# Patient Record
Sex: Female | Born: 2000 | Race: White | Hispanic: No | Marital: Single | Smoking: Current every day smoker
Health system: Southern US, Community
[De-identification: ages and names within clinical notes are randomized; demographics above are authoritative.]

## PROBLEM LIST (undated history)

## (undated) ENCOUNTER — Emergency Department (HOSPITAL_BASED_OUTPATIENT_CLINIC_OR_DEPARTMENT_OTHER): Payer: Medicaid Other

## (undated) DIAGNOSIS — E282 Polycystic ovarian syndrome: Secondary | ICD-10-CM

## (undated) DIAGNOSIS — R011 Cardiac murmur, unspecified: Secondary | ICD-10-CM

## (undated) DIAGNOSIS — R748 Abnormal levels of other serum enzymes: Secondary | ICD-10-CM

## (undated) HISTORY — PX: OTHER SURGICAL HISTORY: SHX169

## (undated) HISTORY — DX: Cardiac murmur, unspecified: R01.1

## (undated) HISTORY — DX: Polycystic ovarian syndrome: E28.2

---
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
## 2020-06-08 ENCOUNTER — Encounter: Payer: Self-pay | Admitting: Family Medicine

## 2020-06-08 ENCOUNTER — Ambulatory Visit (INDEPENDENT_AMBULATORY_CARE_PROVIDER_SITE_OTHER): Payer: Medicaid Other | Admitting: Family Medicine

## 2020-06-08 ENCOUNTER — Other Ambulatory Visit: Payer: Self-pay

## 2020-06-08 DIAGNOSIS — G8929 Other chronic pain: Secondary | ICD-10-CM | POA: Diagnosis not present

## 2020-06-08 DIAGNOSIS — M5441 Lumbago with sciatica, right side: Secondary | ICD-10-CM

## 2020-06-08 NOTE — Progress Notes (Signed)
Office Visit Note   Patient: Lindsay [name]           Date of Birth: August 08, 2000           MRN: [id number] Visit Date: 06/08/2020 Requested by: Berline Lopes, MD [address] [place],  [place] [address] PCP: Berline Lopes, MD  Subjective: Chief Complaint  Patient presents with  . Lower Back - Pain    Pain in the right side of the lower back after changing the cat litter 1&1/2 months ago. She thinks she moved in an awkward way with this. Could barely walk and the pain was going down the leg. The pain goes across the lower back, but is still mainly on the right side. Has finished course of prednisone.  H/o 2 herniated discs in the lsp in 2019 - got better with PT and rest.     HPI: 20yo F presenting to clinic with acute on chronic right sided lower back, radiating into her right posterior thigh. Patient states that she first experienced back pain in 2019, during which an MRI revealed two herniated discs and she did a course of physical therapy. Following PT, she felt much improved, though not 100% pain free. Pain had been management for the past two years, until she was cleaning out a cat box about 6 weeks ago. She went to stand up from cleaning the litter, and says she felt immediate pain in the right side of her lower back.  Her mother took her to a physical therapy for dry needling, but her pain was too significant at the time, and the PT asked her to see her PCM first. PCM gave her a prednisone pack, as well as a toradol injection. This improved her symptoms significantly, and she is feeling much better today, though not yet back to her baseline. She has also done one course of dry needling, but states this was very painful, and she isn't sure she can tolerated continued treatments.  Radiating pain stops at the back of her right knee, and does not travel into her foot. No weakness, no bowel/bladder dysfunction. Pain is worse with going up and down stairs.               ROS:   All  other systems were reviewed and are negative.  Objective: Vital Signs: There were no vitals taken for this visit.  Physical Exam:  General:  Alert and oriented, in no acute distress. Pulm:  Breathing unlabored. Psy:  Normal mood, congruent affect. Skin:  Lower back with no bruising, rashes or erythema.   BACK EXAMINATION: Normal Gait.  Stands with excessive lumbar lordosis, with anterior tilt to pelvis.  ROM: Endorses pain with forward flexion, and is just shy of toe-touch. No significant pain with extension. No pain with rotation. Endorses pain with side-bending to the left.  Palpation: Tenderness to lower lumbar paraspinal muscles on the right, at the area of L4-L5, L5-S1. Tenderness within the right SI Joint. No midline tenderness, no deformity or step-offs. Gluteus musculature and piriformis without tender points.  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  Strength: Hip flexion (L1), Hip Aduction (L2), Knee Extension (L3) are 5/5 Bilaterally Foot Inversion (L4), Dorsiflexion (L5), and Eversion (S1) 5/5 Bilaterally Sensation: Intact to light touch medial and lateral aspects of lower extremities, and lateral, dorsal, and medial aspects of foot.  Reflexes: Patellar (L4) 2+ Bilaterally Special Tests:  FABER causes some SI Joint pain on right Stork test negative bilaterally Trendelenberg  very slightly positive bilaterally SLR: No radiation down Ipilateral or contralateral leg bilaterally Limb Length: Hips Aligned, No obvious discrepancy at medial malleolus      Imaging: No results found.  Assessment & Plan: 20yo F presenting to clinic with concerns of acute on chronic lower back pain, in the setting of historical disc herniations in 2019. Symptoms do sound concerning for radiculopathy, though no red flags on examination today.  - Given improvement with PT in the past, suspect she would benefit from this again. Will place ambulatory referral - Strong family  history of low back pathology. Recommend she find a healthy back routine once she graduates from PT to maintain her core/back strength. Consider Pilates or similar routines.  - No need for repeated imaging at this time. Should her symptoms worsen or fail to improve, she is to return to clinic for reconsideration. - Sacral torsion addressed with HVLA Sacral-C technique, which patient tolerated very well. - Patient and her mother had no further questions or concerns today. They express understanding with plan.      Procedures: No procedures performed        PMFS History: Patient Active Problem List   Diagnosis Date Noted  . Adjustment disorder with mixed anxiety and depressed mood 07/04/2016  . Vitamin D deficiency 10/26/2014  . Hyperlipidemia 10/26/2014  . Acne vulgaris 10/26/2014  . PCOS (polycystic ovarian syndrome) 08/09/2014  . Irregular menses 06/06/2014  . BMI (body mass index), pediatric, > 99% for age 47/22/2016  . Acanthosis nigricans 06/06/2014   Past Medical History:  Diagnosis Date  . Abnormal liver enzymes   . Heart murmur   . PCOS (polycystic ovarian syndrome)     Family History  Problem Relation Age of Onset  . Diabetes Other   . Hypertension Other   . Hyperlipidemia Other   . Heart disease Other     Past Surgical History:  Procedure Laterality Date  . None     Social History   Occupational History  . Not on file  Tobacco Use  . Smoking status: Passive Smoke Exposure - Never Smoker  . Smokeless tobacco: Never Used  . Tobacco comment: dads smokes inside mom smokes outside   Substance and Sexual Activity  . Alcohol use: Not on file  . Drug use: Not on file  . Sexual activity: Not on file

## 2020-06-08 NOTE — Progress Notes (Signed)
I saw and examined the patient with Dr. Marga Hoots and agree with assessment and plan as outlined.    Right LBP since 2019 intermittently.  Flared up recently while lifting cat litter.  Saw Karin Golden but didn't tolerate dry needling.  Neuro exam nonfocal.  Forward tilt of pelvis; tender near right SI and gluteus medius.    Will try PT at [facility] in [place].  Repeat MRI if fails to improve.  Corset brace for comfort.

## 2020-06-28 ENCOUNTER — Encounter: Payer: Self-pay | Admitting: Family Medicine

## 2020-06-28 DIAGNOSIS — G8929 Other chronic pain: Secondary | ICD-10-CM

## 2020-07-13 ENCOUNTER — Other Ambulatory Visit: Payer: Self-pay

## 2020-07-13 ENCOUNTER — Ambulatory Visit: Payer: Medicaid Other | Attending: Family Medicine | Admitting: Physical Therapy

## 2020-07-13 DIAGNOSIS — M6281 Muscle weakness (generalized): Secondary | ICD-10-CM | POA: Insufficient documentation

## 2020-07-13 DIAGNOSIS — M545 Low back pain, unspecified: Secondary | ICD-10-CM | POA: Diagnosis not present

## 2020-07-13 DIAGNOSIS — G8929 Other chronic pain: Secondary | ICD-10-CM

## 2020-07-13 NOTE — Patient Instructions (Addendum)
Access Code: [email or web address] URL: [email or web address] Date: 07/13/2020 Prepared by: Alphonzo Severance  Exercises Marching Bridge - 1 x daily - 7 x weekly - 3 sets - 10 reps Plank on Knees - 1 x daily - 7 x weekly - 1 sets - 3 reps - 30 hold Supine Posterior Pelvic Tilt - 2 x daily - 7 x weekly - 2 sets - 10 reps - 5'' hold

## 2020-07-13 NOTE — Therapy (Signed)
[facility] [address] [place], [place], [address] Phone: [phone]   Fax:  [phone]  Physical Therapy Evaluation  Patient Details  Name: Lindsay [name] MRN: [id number] Date of Birth: March 12, 2000 Referring Provider (PT): Lavada Mesi, MD  Encounter Date: 07/13/2020   PT End of Session - 07/13/20 1435     Visit Number 1    Number of Visits 16    Date for PT Re-Evaluation 09/30/20    Authorization Type [employer] [facility] -    Authorization - Number of Visits 27    PT Start Time 1400    PT Stop Time 1440    PT Time Calculation (min) 40 min    Activity Tolerance Patient tolerated treatment well    Behavior During Therapy [facility] for tasks assessed/performed                    Past Medical History:  Diagnosis Date   Abnormal liver enzymes    Heart murmur    PCOS (polycystic ovarian syndrome)     Past Surgical History:  Procedure Laterality Date   None      There were no vitals filed for this visit.    Subjective Assessment - 07/13/20 1412     Subjective Lindsay [name] is a 20 y.o. female who presents to clinic with chief complaint of low back pain.  MOI/History of condition: history of acute on chronic low back pain after lifting cat litter around May of 2022; some radiating pain into R leg originally but this has now subsided.  Pain location: R sided LBP, near PSIS.  Red flags: denies n/t, denies bb changes, denies saddle anesthesia.  48 hour pain intensity:  highest 2/10, current 1/10, best 1/10.  Aggs: physical activity (no specific movements), lifting.  Eases: rest, lidocaine patches.  Nature: achy with occasional sharp.  Severity: low  Irritability: low.    Stage: chronic.  Stability: getting better.  24 hour pattern: no pattern.  Vocation/requirements: none.  Hobbies: singing and video games.  Functional limitations/goals: be able to self manage back pain.  Home environment: live with family, 7 STE, single story  house.  Assistive device: none.    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, G89.29)    Referring Provider (PT) Lavada Mesi, MD    Onset Date/Surgical Date 07/13/17    Hand Dominance Right    Next MD Visit Hilts, Michael, MD    Prior Therapy multiple years ago for low back      Precautions   Precaution Comments none      Restrictions   Other Position/Activity Restrictions none      Balance Screen   Has the patient fallen in the past 6 months No      Functional Tests   Functional tests Single leg stance;Other;Other2      Single Leg Stance   Comments 30'' able with mild hip drop bil      Other:   Other/ Comments Plank: 30''      Other:   Other/Comments toe and heel walk WNL      Posture/Postural Control   Posture Comments slight anterior pelvic tilt      ROM / Strength   AROM / PROM /  Strength AROM;Strength      AROM   Overall AROM Comments all WNL    AROM Assessment Site Lumbar    Lumbar Flexion P! at end range    Lumbar Extension P! at end range      Strength   Strength Assessment Site Hip;Knee    Right Hip Flexion 5/5    Right Hip Extension 5/5    Right Hip ABduction 5/5    Left Hip Flexion 5/5    Left Hip Extension 4/5    Left Hip ABduction 4/5      Flexibility   Soft Tissue Assessment /Muscle Length yes   L hip flexor tightness> R   Hamstrings WNL    Quadriceps WNL      Palpation   Spinal mobility WNL    Palpation comment TTP lower lumbar spine      Special Tests   Other special tests SLR (-)                        Objective measurements completed on examination: See above findings.               PT Education - 07/13/20 1433     Education Details POC, diagnosis, prognosis, HEP.  Pt educated via explanation, demonstration, and handout (HEP).   Pt confirms understanding verbally.              PT Short Term Goals - 07/13/20 1507       PT SHORT TERM GOAL #1   Title Lindsay [name] will be >75% HEP compliant within 3 weeks to improve carryover between sessions and facilitate independent management of condition.    Target Date 08/03/20               PT Long Term Goals - 07/13/20 1507       PT LONG TERM GOAL #1   Title Lindsay [name] will be able to maintain plank for 40'' (norm for healthy adult is 70'')  EVAL: 20''    Target Date 09/30/20      PT LONG TERM GOAL #2   Title Lindsay [name] will report confidence in self management of condition at time of discharge    Target Date 09/30/20      PT LONG TERM GOAL #3   Title Lindsay [name] will be able to lift 45 lbs from the floor and place on a 3 foot counter, not limited by pain  EVAL: unable    Target Date 09/30/20                    Plan - 07/13/20 1459     Clinical Impression Statement Lindsay [name] is a 20 y.o. female who presents to clinic with signs and sxs consistent with mechanical low back pain.  Pt presents with pain and impairments/deficits in: L hip strength, core strength.  Activity limitations include: lifting, bending.  Participation limitations include: lifting nephew, bending for housework, sitting for videogames.  Pt will benefit from skilled therapy to address pain and the listed deficits in order to achieve functional goals, enable safety and independence in completion of daily tasks, and return to PLOF.    Stability/Clinical Decision Making Stable/Uncomplicated    Clinical Decision Making Low    Rehab Potential Excellent    PT Frequency 2x / week    PT Duration Other (comment)   09/30/2020   PT Treatment/Interventions --   ADLs/Self Care Home Management; Aquatic Therapy; Therapeutic activities;  Therapeutic exercise; Neuromuscular re-education; Manual techniques; Iontophoresis 4mg /ml Dexamethasone; Dry needling  Traction; Spinal  Manipulations; Joint Manipulations;   PT Next Visit Plan progressive core strengthening and D/L progression    PT Home Exercise Plan [email or web address]    Consulted and Agree with Plan of Care Patient             Patient will benefit from skilled therapeutic intervention in order to improve the following deficits and impairments:     Visit Diagnosis: Chronic right-sided low back pain without sciatica  Muscle weakness     Problem List Patient Active Problem List   Diagnosis Date Noted   Adjustment disorder with mixed anxiety and depressed mood 07/04/2016   Vitamin D deficiency 10/26/2014   Hyperlipidemia 10/26/2014   Acne vulgaris 10/26/2014   PCOS (polycystic ovarian syndrome) 08/09/2014   Irregular menses 06/06/2014   BMI (body mass index), pediatric, > 99% for age 27/22/2016   Acanthosis nigricans 06/06/2014    06/08/2014 PT, DPT 07/13/20 3:13 PM  [facility] [address] [place], [place], [place] Phone: [phone]   Fax:  [phone]  Name: Lindsay [name] MRN: Lindsay [name] Date of Birth: 16-Dec-2000

## 2020-08-10 ENCOUNTER — Other Ambulatory Visit: Payer: Self-pay

## 2020-08-10 ENCOUNTER — Ambulatory Visit: Payer: Medicaid Other | Attending: Family Medicine | Admitting: Physical Therapy

## 2020-08-10 ENCOUNTER — Encounter: Payer: Self-pay | Admitting: Physical Therapy

## 2020-08-10 DIAGNOSIS — G8929 Other chronic pain: Secondary | ICD-10-CM | POA: Insufficient documentation

## 2020-08-10 DIAGNOSIS — M6281 Muscle weakness (generalized): Secondary | ICD-10-CM | POA: Diagnosis present

## 2020-08-10 DIAGNOSIS — M545 Low back pain, unspecified: Secondary | ICD-10-CM | POA: Insufficient documentation

## 2020-08-10 NOTE — Therapy (Signed)
[facility] [address] [place], [place], [address] Phone: [phone]   Fax:  [phone]  Physical Therapy Treatment  Patient Details  Name: Lindsay [name] MRN: [id number] Date of Birth: 10/21/2000 Referring Provider (PT): Lavada Mesi, MD   Encounter Date: 08/10/2020   PT End of Session - 08/10/20 1402     Visit Number 2    Number of Visits 16    Date for PT Re-Evaluation 09/30/20    Authorization Type [employer] -    Authorization - Number of Visits 27    PT Start Time 1400    PT Stop Time 1443    PT Time Calculation (min) 43 min    Activity Tolerance Patient tolerated treatment well    Behavior During Therapy WFL for tasks assessed/performed             Past Medical History:  Diagnosis Date   Abnormal liver enzymes    Heart murmur    PCOS (polycystic ovarian syndrome)     Past Surgical History:  Procedure Laterality Date   None      There were no vitals filed for this visit.   Subjective Assessment - 08/10/20 1406     Subjective Pt reports that her back has been doing well overall.  [place] the [place] and had some back pain following standing and walking during the day.  Currently 0/10 pain.  She reports that she has been HEP compliant.    Pertinent History History of 2 bulging disks 2019            [facility] Adult PT Treatment/Exercise:  Therapeutic Exercise: - Recumbent bike - 5' L4 - Side plank - 15'' x4 ea - Side plank with clamshell - 3x5 - bird dog - 3x6 with 5'' hold - dead bug with ball - 3x10 - dead lift - 8'' step - 3x6 @25 # - S/L bridge - 2x10 ea    PT Short Term Goals - 07/13/20 1507       PT SHORT TERM GOAL #1   Title 07/15/20 will be >75% HEP compliant within 3 weeks to improve carryover between sessions and facilitate independent management of condition.    Target Date 08/03/20               PT Long Term Goals - 07/13/20 1507       PT LONG TERM GOAL #1    Title 07/15/20 will be able to maintain plank for 40'' (norm for healthy adult is 70'')  EVAL: 20''    Target Date 09/30/20      PT LONG TERM GOAL #2   Title Lindsay [name] will report confidence in self management of condition at time of discharge    Target Date 09/30/20      PT LONG TERM GOAL #3   Title Lindsay [name] will be able to lift 45 lbs from the floor and place on a 3 foot counter, not limited by pain  EVAL: unable    Target Date 09/30/20                   Plan - 08/10/20 1428     Clinical Impression Statement Pt reports no increase in baseline pain following therapy  HEP was updated and reissued to patient    Overall, Lindsay [name] is progressing well with therapy.  Today we concentrated on core strengthening and hip strengthening.  Pt shows good progress with exercise intensity.  We  will continue lumbar strengthening and hip strengthening as tolerated.  Pt will continue to benefit from skilled physical therapy to address remaining deficits and achieve listed goals.  Continue per POC.    Stability/Clinical Decision Making Stable/Uncomplicated    Rehab Potential Excellent    PT Frequency 2x / week    PT Duration Other (comment)   09/30/2020   PT Treatment/Interventions --   ADLs/Self Care Home Management; Aquatic Therapy; Therapeutic activities; Therapeutic exercise; Neuromuscular re-education; Manual techniques; Iontophoresis 4mg /ml Dexamethasone; Dry needling  Traction; Spinal Manipulations; Joint Manipulations;   PT Next Visit Plan progressive core strengthening and D/L progression    PT Home Exercise Plan [id number]    Consulted and Agree with Plan of Care Patient             Patient will benefit from skilled therapeutic intervention in order to improve the following deficits and impairments:     Visit Diagnosis: Chronic right-sided low back pain without sciatica  Muscle weakness     Problem List Patient Active Problem List   Diagnosis Date  Noted   Adjustment disorder with mixed anxiety and depressed mood 07/04/2016   Vitamin D deficiency 10/26/2014   Hyperlipidemia 10/26/2014   Acne vulgaris 10/26/2014   PCOS (polycystic ovarian syndrome) 08/09/2014   Irregular menses 06/06/2014   BMI (body mass index), pediatric, > 99% for age 68/22/2016   Acanthosis nigricans 06/06/2014    06/08/2014 PT, DPT 08/10/20 2:46 PM   [facility] [address] [place], [place], [place] Phone: [phone]   Fax:  [phone]  Name: Lindsay [name] MRN: [name] Date of Birth: 2000-05-25

## 2020-08-12 ENCOUNTER — Ambulatory Visit: Payer: Medicaid Other | Admitting: Physical Therapy

## 2020-08-12 ENCOUNTER — Encounter: Payer: Self-pay | Admitting: Physical Therapy

## 2020-08-12 ENCOUNTER — Other Ambulatory Visit: Payer: Self-pay

## 2020-08-12 DIAGNOSIS — M545 Low back pain, unspecified: Secondary | ICD-10-CM

## 2020-08-12 DIAGNOSIS — G8929 Other chronic pain: Secondary | ICD-10-CM

## 2020-08-12 DIAGNOSIS — M6281 Muscle weakness (generalized): Secondary | ICD-10-CM

## 2020-08-12 NOTE — Therapy (Signed)
Lindsay [name] [address] [place], [place], [address] Phone: [phone]   Fax:  [phone]  Physical Therapy Treatment  Patient Details  Name: Lindsay [name] MRN: [id number] Date of Birth: 2000-10-15 Referring Provider (PT): Lindsay Mesi, MD   Encounter Date: 08/12/2020   PT End of Session - 08/12/20 1347     Visit Number 3    Number of Visits 16    Date for PT Re-Evaluation 09/30/20    Authorization Type [employer] -    Authorization - Number of Visits 27    PT Start Time 0145    PT Stop Time 0227    PT Time Calculation (min) 42 min    Activity Tolerance Patient tolerated treatment well    Behavior During Therapy [facility] for tasks assessed/performed             Past Medical History:  Diagnosis Date   Abnormal liver enzymes    Heart murmur    PCOS (polycystic ovarian syndrome)     Past Surgical History:  Procedure Laterality Date   None      There were no vitals filed for this visit.   Subjective Assessment - 08/12/20 1347     Subjective Pt reports that she has some muscular soreness after last session.  She feels last session was challenging.  She currenlty has 0/10 pain.    Pertinent History History of 2 bulging disks 2019             [facility] Adult PT Treatment/Exercise:   Therapeutic Exercise: - Recumbent bike - 5' L4 - Side plank with clamshell - 3x7 ea - RTB  - bird dog - 3x8 with 5'' hold - dead bug with ball - 3x10 - dead lift - 8'' step - 1x5 @25 #, 10 individual lifts @45  lbs - S/L bridge - 2x10 ea - 2'' hold - Hip abd machine @25  3x8 ea    PT Short Term Goals - 07/13/20 1507       PT SHORT TERM GOAL #1   Title will be >75% HEP compliant within 3 weeks to improve carryover between sessions and facilitate independent management of condition.    Target Date 08/03/20               PT Long Term Goals - 07/13/20 1507       PT LONG TERM GOAL #1   Title Lindsay [name]  will be able to maintain plank for 40'' (norm for healthy adult is 70'')  EVAL: 20''    Target Date 09/30/20      PT LONG TERM GOAL #2   Title Lindsay [name] will report confidence in self management of condition at time of discharge    Target Date 09/30/20      PT LONG TERM GOAL #3   Title Lindsay [name] will be able to lift 45 lbs from the floor and place on a 3 foot counter, not limited by pain  EVAL: unable    Target Date 09/30/20                   Plan - 08/12/20 1421     Clinical Impression Statement Pt reports no increase in baseline pain following therapy  HEP was reviewed, but left unchanged    Overall, Lindsay [name] is progressing very well with therapy.  Today we concentrated on core strengthening and hip strengthening.  Intensity left roughly the same as pt reported DOMS and significant fatigue after  last session; this session tolerated well.  Pt will continue to benefit from skilled physical therapy to address remaining deficits and achieve listed goals.  Continue per POC.    Stability/Clinical Decision Making Stable/Uncomplicated    Rehab Potential Excellent    PT Frequency 2x / week    PT Duration Other (comment)   09/30/2020   PT Treatment/Interventions --   ADLs/Self Care Home Management; Aquatic Therapy; Therapeutic activities; Therapeutic exercise; Neuromuscular re-education; Manual techniques; Iontophoresis 4mg /ml Dexamethasone; Dry needling  Traction; Spinal Manipulations; Joint Manipulations;   PT Next Visit Plan progressive core strengthening and D/L progression    PT Home Exercise Plan [id number]    Consulted and Agree with Plan of Care Patient             Patient will benefit from skilled therapeutic intervention in order to improve the following deficits and impairments:     Visit Diagnosis: Chronic right-sided low back pain without sciatica  Muscle weakness     Problem List Patient Active Problem List   Diagnosis Date Noted    Adjustment disorder with mixed anxiety and depressed mood 07/04/2016   Vitamin D deficiency 10/26/2014   Hyperlipidemia 10/26/2014   Acne vulgaris 10/26/2014   PCOS (polycystic ovarian syndrome) 08/09/2014   Irregular menses 06/06/2014   BMI (body mass index), pediatric, > 99% for age 17/22/2016   Acanthosis nigricans 06/06/2014   06/08/2014 PT, DPT 08/12/20 2:31 PM  [name] [address] [place], [place], [place] Phone: [phone]   Fax:  [phone]  Name: Lindsay [name] MRN: Lindsay [name] Date of Birth: [birth year]

## 2020-08-16 ENCOUNTER — Encounter: Payer: Self-pay | Admitting: Physical Therapy

## 2020-08-16 ENCOUNTER — Other Ambulatory Visit: Payer: Self-pay

## 2020-08-16 ENCOUNTER — Ambulatory Visit: Payer: Medicaid Other | Attending: Family Medicine | Admitting: Physical Therapy

## 2020-08-16 DIAGNOSIS — M545 Low back pain, unspecified: Secondary | ICD-10-CM | POA: Insufficient documentation

## 2020-08-16 DIAGNOSIS — G8929 Other chronic pain: Secondary | ICD-10-CM

## 2020-08-16 DIAGNOSIS — M6281 Muscle weakness (generalized): Secondary | ICD-10-CM | POA: Insufficient documentation

## 2020-08-16 NOTE — Therapy (Signed)
[facility] [address] [place], [place], [address] Phone: [phone]   Fax:  [phone]  Physical Therapy Treatment  Patient Details  Name: Lindsay [name] MRN: [id number] Date of Birth: 05/16/2000 Referring Provider (PT): Lindsay Mesi, MD   Encounter Date: 08/16/2020   PT End of Session - 08/16/20 1401     Visit Number 4    Number of Visits 16    Date for PT Re-Evaluation 09/30/20    Authorization Type [employer] -    Authorization - Number of Visits 27    PT Start Time 0200    PT Stop Time 0243    PT Time Calculation (min) 43 min    Activity Tolerance Patient tolerated treatment well    Behavior During Therapy [facility] for tasks assessed/performed             Past Medical History:  Diagnosis Date   Abnormal liver enzymes    Heart murmur    PCOS (polycystic ovarian syndrome)     Past Surgical History:  Procedure Laterality Date   None      There were no vitals filed for this visit.   Subjective Assessment - 08/16/20 1404     Subjective Pt reports that she is doing well well and improving overall.  She still gets some back pain when standing or doing extended exercise, but this is improivng.  3/10 back pain over the weekend while completing a concealed carry permit course.  0/10 back pain currently.    Pertinent History History of 2 bulging disks 2019             [facility] Adult PT Treatment/Exercise:   Therapeutic Exercise: - Recumbent bike - 5' L4 - LE X-over - 2x10 ea - Side plank with clamshell - 2x10 ea - GTB  - bird dog - 10x with 10'' hold - 1/2 kneeling chop - 10x ea - 10 lbs - dead bug with ball - 2x14 - dead lift - 2x5 @25 #, 10 individual lifts @45  lbs - S/L bridge - 3x5 ea with 5# KB hold - Hip abd machine @25  3x8 ea     PT Short Term Goals - 07/13/20 1507       PT SHORT TERM GOAL #1   Title will be >75% HEP compliant within 3 weeks to improve carryover between sessions  and facilitate independent management of condition.    Target Date 08/03/20               PT Long Term Goals - 07/13/20 1507       PT LONG TERM GOAL #1   Title Lindsay [name] will be able to maintain plank for 40'' (norm for healthy adult is 70'')  EVAL: 20''    Target Date 09/30/20      PT LONG TERM GOAL #2   Title Lindsay [name] will report confidence in self management of condition at time of discharge    Target Date 09/30/20      PT LONG TERM GOAL #3   Title Lindsay [name] will be able to lift 45 lbs from the floor and place on a 3 foot counter, not limited by pain  EVAL: unable    Target Date 09/30/20                   Plan - 08/16/20 1439     Clinical Impression Statement Pt reports no increase in baseline pain following therapy  HEP was not updated  as current program meets patient's needs    Overall, Lindsay [name] is progressing well with therapy.  Today we concentrated on core strengthening and lower extremity strengthening.  Pt is consistently tolerating higher load with core strengthening exercises and exhibits improved control with S/L bridge with minimal pelvic rotation.  Able to D/L KB from ground today with min cuing and good form.  Pt will continue to benefit from skilled physical therapy to address remaining deficits and achieve listed goals.  Continue per POC.    Stability/Clinical Decision Making Stable/Uncomplicated    Rehab Potential Excellent    PT Frequency 2x / week    PT Duration Other (comment)   09/30/2020   PT Treatment/Interventions --   ADLs/Self Care Home Management; Aquatic Therapy; Therapeutic activities; Therapeutic exercise; Neuromuscular re-education; Manual techniques; Iontophoresis 4mg /ml Dexamethasone; Dry needling  Traction; Spinal Manipulations; Joint Manipulations;   PT Next Visit Plan progressive core strengthening and D/L progression    PT Home Exercise Plan [id number]    Consulted and Agree with Plan of Care Patient              Patient will benefit from skilled therapeutic intervention in order to improve the following deficits and impairments:     Visit Diagnosis: Chronic right-sided low back pain without sciatica  Muscle weakness     Problem List Patient Active Problem List   Diagnosis Date Noted   Adjustment disorder with mixed anxiety and depressed mood 07/04/2016   Vitamin D deficiency 10/26/2014   Hyperlipidemia 10/26/2014   Acne vulgaris 10/26/2014   PCOS (polycystic ovarian syndrome) 08/09/2014   Irregular menses 06/06/2014   BMI (body mass index), pediatric, > 99% for age 04/06/2014   Acanthosis nigricans 06/06/2014    06/08/2014 PT, DPT 08/16/20 2:39 PM   [facility] [address] [place], [place], [place] Phone: [phone]   Fax:  [phone]  Name: Lindsay [name] MRN: Lindsay [name] Date of Birth: [birth year]

## 2020-08-26 ENCOUNTER — Ambulatory Visit: Payer: Medicaid Other | Admitting: Physical Therapy

## 2020-08-26 ENCOUNTER — Other Ambulatory Visit: Payer: Self-pay

## 2020-08-26 ENCOUNTER — Encounter: Payer: Self-pay | Admitting: Physical Therapy

## 2020-08-26 DIAGNOSIS — G8929 Other chronic pain: Secondary | ICD-10-CM

## 2020-08-26 DIAGNOSIS — M6281 Muscle weakness (generalized): Secondary | ICD-10-CM

## 2020-08-26 DIAGNOSIS — M545 Low back pain, unspecified: Secondary | ICD-10-CM | POA: Diagnosis not present

## 2020-08-26 NOTE — Therapy (Addendum)
[facility] [address] [place], [place], [address] Phone: [phone]   Fax:  [phone]  Physical Therapy Treatment  Patient Details  Name: Lindsay [name] MRN: [id number] Date of Birth: 02/10/2000 Referring Provider (PT): Lavada Mesi, MD   Encounter Date: 08/26/2020   PT End of Session - 08/26/20 1340     Visit Number 5    Number of Visits 16    Date for PT Re-Evaluation 09/30/20    Authorization Type [employer] Approved 12 Pt visits from 08/10/2020-10/09/2020    Authorization - Number of Visits 12    PT Start Time 1341    PT Stop Time 1425    PT Time Calculation (min) 44 min    Activity Tolerance Patient tolerated treatment well    Behavior During Therapy [facility] for tasks assessed/performed             Past Medical History:  Diagnosis Date   Abnormal liver enzymes    Heart murmur    PCOS (polycystic ovarian syndrome)     Past Surgical History:  Procedure Laterality Date   None      There were no vitals filed for this visit.    [facility] Adult PT Treatment/Exercise:   Therapeutic Exercise, to improve core strength to reduce low back pain.  Pt cued for sequencing and form throughout: - Recumbent bike - 5' L4 - LE X-over - 2x10 ea - bird dog - 2x6 with 10'' hold with hand/knee tap - dead lift - 2x @45 #  2x5 @ 55# (raised bar in squat rack) - S/L bridge - 3x7 ea with 5# KB hold - RDL with reach to chair with furniture slider - 2x10 ea - RDL reaching to chair - 2x10 ea  NOT PERFORMED TODAY  - 1/2 kneeling chop - 10x ea - 10 lbs - dead bug with ball - 2x14  - Side plank with clamshell - 2x10 ea - GTB (not today) - Hip abd machine @25  3x8 ea   PT Short Term Goals - 08/27/20 0837       PT SHORT TERM GOAL #1   Title will be >75% HEP compliant within 3 weeks to improve carryover between sessions and facilitate independent management of condition.    Status Achieved    Target Date 08/03/20                PT Long Term Goals - 07/13/20 1507       PT LONG TERM GOAL #1   Title 08/05/20 will be able to maintain plank for 40'' (norm for healthy adult is 70'')  EVAL: 20''    Target Date 09/30/20      PT LONG TERM GOAL #2   Title Lindsay [name] will report confidence in self management of condition at time of discharge    Target Date 09/30/20      PT LONG TERM GOAL #3   Title Lindsay [name] will be able to lift 45 lbs from the floor and place on a 3 foot counter, not limited by pain  EVAL: unable    Target Date 09/30/20                   Plan - 08/26/20 1356     Clinical Impression Statement Pt reports mild pain reduction following therapy  HEP was reviewed, but left unchanged    Overall, Lindsay [name] is progressing well with therapy.  Today we concentrated on core strengthening.  Intensity left  roughly the same d/t time since last visit and increased baseline pain today.  Will continue to progress as able.  Pt does well with min cuing for D/L form today with good carry-over from pervious sessions.  Pt will continue to benefit from skilled physical therapy to address remaining deficits and achieve listed goals.  Continue per POC.    Stability/Clinical Decision Making Stable/Uncomplicated    Rehab Potential Excellent    PT Frequency 2x / week    PT Duration Other (comment)   09/30/2020   PT Treatment/Interventions ADLs/Self Care Home Management;Aquatic Therapy;Iontophoresis 4mg /ml Dexamethasone;Gait training;Therapeutic activities;Therapeutic exercise;Neuromuscular re-education;Manual techniques;Dry needling;Joint Manipulations;Spinal Manipulations    PT Next Visit Plan progressive core strengthening and D/L progression    PT Home Exercise Plan [id number]    Consulted and Agree with Plan of Care Patient             Patient will benefit from skilled therapeutic intervention in order to improve the following deficits and impairments:  Pain, Decreased  strength  Visit Diagnosis: Chronic right-sided low back pain without sciatica  Muscle weakness     Problem List Patient Active Problem List   Diagnosis Date Noted   Adjustment disorder with mixed anxiety and depressed mood 07/04/2016   Vitamin D deficiency 10/26/2014   Hyperlipidemia 10/26/2014   Acne vulgaris 10/26/2014   PCOS (polycystic ovarian syndrome) 08/09/2014   Irregular menses 06/06/2014   BMI (body mass index), pediatric, > 99% for age 20/22/2016   Acanthosis nigricans 06/06/2014    06/08/2014 PT, DPT 08/27/20 8:41 AM   [facility] [address] [place], [place], [place] Phone: [phone]   Fax:  [phone]  Name: Lindsay [name] MRN: [name] Date of Birth: 06/25/2000

## 2020-08-29 ENCOUNTER — Ambulatory Visit: Payer: Medicaid Other

## 2020-09-02 ENCOUNTER — Encounter: Payer: Self-pay | Admitting: Physical Therapy

## 2020-09-02 ENCOUNTER — Ambulatory Visit: Payer: Medicaid Other | Admitting: Physical Therapy

## 2020-09-02 ENCOUNTER — Other Ambulatory Visit: Payer: Self-pay

## 2020-09-02 DIAGNOSIS — G8929 Other chronic pain: Secondary | ICD-10-CM

## 2020-09-02 DIAGNOSIS — M6281 Muscle weakness (generalized): Secondary | ICD-10-CM

## 2020-09-02 DIAGNOSIS — M545 Low back pain, unspecified: Secondary | ICD-10-CM | POA: Diagnosis not present

## 2020-09-02 NOTE — Therapy (Signed)
[place], [place], [address] Phone: [phone]   Fax:  [phone]  Physical Therapy Treatment  Patient Details  Name: Lindsay [name] MRN: [id number] Date of Birth: 05/12/2000 Referring Provider (PT): Eunice Blase, MD   Encounter Date: 09/02/2020   PT End of Session - 09/02/20 1334     Visit Number 6    Number of Visits 16    Date for PT Re-Evaluation 09/30/20    Authorization Type [employer] Approved 12 Pt visits from 08/10/2020-10/09/2020    Authorization - Number of Visits 12    PT Start Time 0135    PT Stop Time 0215    PT Time Calculation (min) 40 min    Activity Tolerance Patient tolerated treatment well    Behavior During Therapy Lindsay [name] for tasks assessed/performed             Past Medical History:  Diagnosis Date   Abnormal liver enzymes    Heart murmur    PCOS (polycystic ovarian syndrome)     Past Surgical History:  Procedure Laterality Date   None      There were no vitals filed for this visit.   Subjective Assessment - 09/02/20 1339     Subjective Pt reports that she is doing well overall.  She feels she is able to handle more activity, including walking and lifting, before onset of pain.  Her pain reached 4/10 after watching a 20 year old.  Her pain is currently 0/10.    Pertinent History History of 2 bulging disks 2019            full plank: 28''  [facility] Adult PT Treatment/Exercise:   Therapeutic Exercise, to improve core strength to reduce low back pain.  Pt cued for sequencing and form throughout: - Recumbent bike - 5' L4 - LE X-over - 2x10 ea - bird dog - 2x6 with 10'' hold with hand/knee tap - Side plank - 10'' x5 - plank from toes - 2 x 30''  - dead lift - 2x @ 55# (raised bar in squat rack), 2x @65 #, 2x @ 65# - S/L bridge - 2x10 ea with 5# KB hold (with OH flexion) - RDL with reach to chair with furniture slider - 2x10 ea - RDL to 8'' box 3x5 ea - 15#   NOT  PERFORMED TODAY   - 1/2 kneeling chop - 10x ea - 10 lbs - dead bug with ball - 2x14  - Side plank with clamshell - 2x10 ea - GTB (not today) - Hip abd machine @25  3x8 ea   PT Short Term Goals - 08/27/20 0837       PT SHORT TERM GOAL #1   Title Lindsay [name] [name] be >75% HEP compliant within 3 weeks to improve carryover between sessions and facilitate independent management of condition.    Status Achieved    Target Date 08/03/20               PT Long Term Goals - 09/02/20 1351       PT LONG TERM GOAL #1   Title Lindsay [name] [name] be able to maintain plank for 44'' (norm for healthy adult is 70'')  EVAL: 20'' (from knees)    Baseline 8/19: 28'' from toes    Target Date 09/30/20      PT LONG TERM GOAL #2   Title Lindsay [name] [name] report confidence in self management of condition at time of discharge    Status  On-going    Target Date 09/30/20      PT LONG TERM GOAL #3   Title Lindsay [name] [name] be able to lift 45 lbs from the floor and place on a 3 foot counter, not limited by pain  EVAL: unable    Baseline 8/19: MET    Status Achieved    Target Date 09/30/20                   Plan - 09/02/20 1407     Clinical Impression Statement [name] is progressing well.  She met her lifting goal and is progressing well with plank goal.  [name] continue to progress core and hip strength as tolerated.  Continue per POC.    Stability/Clinical Decision Making Stable/Uncomplicated    Rehab Potential Excellent    PT Frequency 2x / week    PT Duration Other (comment)   09/30/2020   PT Treatment/Interventions ADLs/Self Care Home Management;Aquatic Therapy;Iontophoresis 77m/ml Dexamethasone;Gait training;Therapeutic activities;Therapeutic exercise;Neuromuscular re-education;Manual techniques;Dry needling;Joint Manipulations;Spinal Manipulations    PT Next Visit Plan progressive core strengthening and D/L progression    PT Home Exercise Plan [id number]    Consulted and Agree with Plan  of Care Patient             Patient [name] benefit from skilled therapeutic intervention in order to improve the following deficits and impairments:  Pain, Decreased strength  Visit Diagnosis: Chronic right-sided low back pain without sciatica  Muscle weakness     Problem List Patient Active Problem List   Diagnosis Date Noted   Adjustment disorder with mixed anxiety and depressed mood 07/04/2016   Vitamin D deficiency 10/26/2014   Hyperlipidemia 10/26/2014   Acne vulgaris 10/26/2014   PCOS (polycystic ovarian syndrome) 08/09/2014   Irregular menses 06/06/2014   BMI (body mass index), pediatric, > 99% for age 23/22/2016   Acanthosis nigricans 06/06/2014    KShearon BaloPT, DPT 09/02/20 2:10 PM  [facility]GCentral [place] [address]Phone: [phone]  Fax:  [phone] Name: KGilbert NarainMRN: [id number] of Birth: 723-Aug-2002

## 2020-09-05 ENCOUNTER — Ambulatory Visit: Payer: Medicaid Other

## 2020-09-06 ENCOUNTER — Other Ambulatory Visit: Payer: Self-pay

## 2020-09-06 ENCOUNTER — Encounter: Payer: Self-pay | Admitting: Physical Therapy

## 2020-09-06 ENCOUNTER — Ambulatory Visit: Payer: Medicaid Other | Admitting: Physical Therapy

## 2020-09-06 DIAGNOSIS — M6281 Muscle weakness (generalized): Secondary | ICD-10-CM

## 2020-09-06 DIAGNOSIS — G8929 Other chronic pain: Secondary | ICD-10-CM

## 2020-09-06 DIAGNOSIS — M545 Low back pain, unspecified: Secondary | ICD-10-CM

## 2020-09-06 NOTE — Therapy (Signed)
Lindsay [name], Lindsay [name], [address] Phone: [phone]   Fax:  [phone]  Physical Therapy Treatment  Patient Details  Name: Lindsay [name] MRN: [id number] Date of Birth: [birth year] Referring Provider (PT): Eunice Blase, MD   Encounter Date: 09/06/2020   PT End of Session - 09/06/20 1042     Visit Number 7    Number of Visits 16    Date for PT Re-Evaluation 09/30/20    Authorization Type Rad[employer] Approved 12 Pt visits from 08/10/2020-10/09/2020    Authorization - Number of Visits 12    PT Start Time 3536    PT Stop Time 1130    PT Time Calculation (min) 45 min    Activity Tolerance Patient tolerated treatment well    Behavior During Therapy [facility] for tasks assessed/performed             Past Medical History:  Diagnosis Date   Abnormal liver enzymes    Heart murmur    PCOS (polycystic ovarian syndrome)     Past Surgical History:  Procedure Laterality Date   None      There were no vitals filed for this visit.   Subjective Assessment - 09/06/20 1043     Subjective Pt reports that she is doing well.  She was somewhat sick over the weekend (food poisoning) but is better now.  Her pain reached 2/10 over the weekend.  Her pain is currently 0/10.    Pertinent History History of 2 bulging disks 2019             full plank: 28''   [facility] Adult PT Treatment/Exercise:   Therapeutic Exercise, to improve core strength to reduce low back pain.  Pt cued for sequencing, pacing, and form throughout: - Recumbent bike - 5' L5 - LE X-over - 2x10 ea - bird dog - 2x8 with 10'' hold with hand/knee tap - Side plank - 15'' x5 - plank from toes - 2 x 35''  - S/L bridge - 2x10 ea with 7# KB hold (with OH flexion) - RDL to 8'' box 3x5 ea - 25# - with toe trag - dead lift - (raised bar in squat rack) 3x @ 75#, 2x3 @85 #   NOT PERFORMED TODAY   - 1/2 kneeling chop - 10x ea - 10 lbs - dead bug with ball - 2x14  -  Side plank with clamshell - 2x10 ea - GTB (not today) - Hip abd machine @25  3x8 ea   PT Short Term Goals - 08/27/20 0837       PT SHORT TERM GOAL #1   Title Lindsay [name] will be >75% HEP compliant within 3 weeks to improve carryover between sessions and facilitate independent management of condition.    Status Achieved    Target Date 08/03/20               PT Long Term Goals - 09/02/20 1351       PT LONG TERM GOAL #1   Title Lindsay [name] will be able to maintain plank for 70'' (norm for healthy adult is 70'')  EVAL: 20'' (from knees)    Baseline 8/19: 28'' from toes    Target Date 09/30/20      PT LONG TERM GOAL #2   Title Lindsay [name] will report confidence in self management of condition at time of discharge    Status On-going    Target Date 09/30/20      PT LONG TERM GOAL #  3   Title Lindsay [name] will be able to lift 45 lbs from the floor and place on a 3 foot counter, not limited by pain  EVAL: unable    Baseline 8/19: MET    Status Achieved    Target Date 09/30/20                   Plan - 09/06/20 1050     Clinical Impression Statement Lindsay [name] continues to progress well with therapy.  We continue to progress loading of core and hips to good effect.  She has no increase in pain today.  Continue per POC.    Stability/Clinical Decision Making Stable/Uncomplicated    Rehab Potential Excellent    PT Frequency 2x / week    PT Duration Other (comment)   09/30/2020   PT Treatment/Interventions ADLs/Self Care Home Management;Aquatic Therapy;Iontophoresis 67m/ml Dexamethasone;Gait training;Therapeutic activities;Therapeutic exercise;Neuromuscular re-education;Manual techniques;Dry needling;Joint Manipulations;Spinal Manipulations    PT Next Visit Plan progressive core strengthening and D/L progression    PT Home Exercise Plan [id number]    Consulted and Agree with Plan of Care Patient             Patient will benefit from skilled therapeutic intervention in  order to improve the following deficits and impairments:  Pain, Decreased strength  Visit Diagnosis: Chronic right-sided low back pain without sciatica  Muscle weakness     Problem List Patient Active Problem List   Diagnosis Date Noted   Adjustment disorder with mixed anxiety and depressed mood 07/04/2016   Vitamin D deficiency 10/26/2014   Hyperlipidemia 10/26/2014   Acne vulgaris 10/26/2014   PCOS (polycystic ovarian syndrome) 08/09/2014   Irregular menses 06/06/2014   BMI (body mass index), pediatric, > 99% for age 31/22/2016   Acanthosis nigricans 06/06/2014    KShearon BaloPT, DPT 09/06/20 11:25 AM  [facility] [address]Phone: [phone]  Fax:  [phone] Name: KNellene CourtoisMRN: [id number] of Birth: 707-24-2002

## 2020-09-09 ENCOUNTER — Ambulatory Visit: Payer: Medicaid Other | Admitting: Physical Therapy

## 2020-09-12 ENCOUNTER — Ambulatory Visit: Payer: Medicaid Other

## 2020-09-13 ENCOUNTER — Ambulatory Visit: Payer: Medicaid Other

## 2020-09-13 ENCOUNTER — Other Ambulatory Visit: Payer: Self-pay

## 2020-09-13 DIAGNOSIS — M545 Low back pain, unspecified: Secondary | ICD-10-CM | POA: Diagnosis not present

## 2020-09-13 DIAGNOSIS — M6281 Muscle weakness (generalized): Secondary | ICD-10-CM

## 2020-09-13 DIAGNOSIS — G8929 Other chronic pain: Secondary | ICD-10-CM

## 2020-09-13 NOTE — Therapy (Signed)
Lindsay [name], [place], [address] Phone: [phone]   Fax:  [phone]  Physical Therapy Treatment  Patient Details  Name: Lindsay [name] MRN: [id number] Date of Birth: 02-21-2000 Referring Provider (PT): Lindsay Blase, MD   Encounter Date: 09/13/2020   PT End of Session - 09/13/20 1145     Visit Number 8    Number of Visits 16    Date for PT Re-Evaluation 09/30/20    Authorization Type RadMD Approved 12 Pt visits from 08/10/2020-10/09/2020    Authorization - Visit Number 7    Authorization - Number of Visits 12    PT Start Time 6734    PT Stop Time 1226    PT Time Calculation (min) 41 min    Activity Tolerance Patient tolerated treatment well    Behavior During Therapy [facility] for tasks assessed/performed             Past Medical History:  Diagnosis Date   Abnormal liver enzymes    Heart murmur    PCOS (polycystic ovarian syndrome)     Past Surgical History:  Procedure Laterality Date   None      There were no vitals filed for this visit.   Subjective Assessment - 09/13/20 1146     Subjective She reports the back is feeling alright. She has been doing a lot lately, so it's been a little sore.    Pertinent History History of 2 bulging disks 2019    Currently in Pain? No/denies                    Therapeutic Exercise, to improve core strength to reduce low back pain.  Pt cued for sequencing, pacing, and form throughout: - Recumbent bike - 5' L5 - prone press up 1 x 10  - fire hydrant 2 x 10 green band  - HS curl on stability ball 2 x 10  - Donkey Kick with 5# dumbbell 2 x 10  - Side plank 3 x 20 sec bilateral  - Reverse lunge on slider 2 x 10  - Step up knee driver 2 x 10 10 inch step        NOT PERFORMED TODAY - bird dog - 2x8 with 10'' hold with hand/knee tap - - LE X-over - 2x10 ea - 1/2 kneeling chop - 10x ea - 10 lbs - dead bug with ball - 2x14  - Side plank with  clamshell - 2x10 ea - GTB (not today) - Hip abd machine _0  3x8 ea - RDL to 8'' box 3x5 ea - 25# - with toe trag - dead lift - (raised bar in squat rack) 3x @ 75#, 2x3 _1 # -- S/L bridge - 2x10 ea with 7# KB hold (with OH flexion) - Side plank - 15'' x5 - plank from toes - 2 x 35''                        PT Short Term Goals - 08/27/20 0837       PT SHORT TERM GOAL #1   Title Lindsay [name] will be >75% HEP compliant within 3 weeks to improve carryover between sessions and facilitate independent management of condition.    Status Achieved    Target Date 08/03/20               PT Long Term Goals - 09/02/20 1351       PT LONG TERM GOAL #  1   Title Lindsay [name] will be able to maintain plank for 74'' (norm for healthy adult is 70'')  EVAL: 20'' (from knees)    Baseline 8/19: 28'' from toes    Target Date 09/30/20      PT LONG TERM GOAL #2   Title Lindsay [name] will report confidence in self management of condition at time of discharge    Status On-going    Target Date 09/30/20      PT LONG TERM GOAL #3   Title Lindsay [name] will be able to lift 45 lbs from the floor and place on a 3 foot counter, not limited by pain  EVAL: unable    Baseline 8/19: MET    Status Achieved    Target Date 09/30/20                   Plan - 09/13/20 1148     Clinical Impression Statement Lindsay [name] tolerated session well today without reports of pain/soreness. Able to progress quadruped hip/core strengthening with patient demonstrating good lumbopelvic stability. Able to maintain side plank for longer duraton with good form. She reported muscle fatigue at end of session.    Stability/Clinical Decision Making --    Rehab Potential --    PT Frequency --    PT Duration --    PT Treatment/Interventions ADLs/Self Care Home Management;Aquatic Therapy;Iontophoresis 88m/ml Dexamethasone;Gait training;Therapeutic activities;Therapeutic exercise;Neuromuscular re-education;Manual  techniques;Dry needling;Joint Manipulations;Spinal Manipulations    PT Next Visit Plan progressive core strengthening and D/L progression    PT Home Exercise Plan [id number]    Consulted and Agree with Plan of Care Patient             Patient will benefit from skilled therapeutic intervention in order to improve the following deficits and impairments:  Pain, Decreased strength  Visit Diagnosis: Chronic right-sided low back pain without sciatica  Muscle weakness     Problem List Patient Active Problem List   Diagnosis Date Noted   Adjustment disorder with mixed anxiety and depressed mood 07/04/2016   Vitamin D deficiency 10/26/2014   Hyperlipidemia 10/26/2014   Acne vulgaris 10/26/2014   PCOS (polycystic ovarian syndrome) 08/09/2014   Irregular menses 06/06/2014   BMI (body mass index), pediatric, > 99% for age 68/22/2016   Acanthosis nigricans 06/06/2014   Lindsay [name] PT, DPT, ATC 09/13/20 12:27 PM  [facility] CFl[facility] Healthcare[address] StreetGDubach N[place] [address]Phone: [phone]  Fax:  [phone] Name: KSilvina HacklemanMRN: [id number] of Birth: 7Aug 25, 2002

## 2020-09-16 ENCOUNTER — Ambulatory Visit: Payer: Medicaid Other | Admitting: Physical Therapy

## 2020-09-20 ENCOUNTER — Ambulatory Visit: Payer: Medicaid Other | Admitting: Physical Therapy

## 2020-09-23 ENCOUNTER — Other Ambulatory Visit: Payer: Self-pay

## 2020-09-23 ENCOUNTER — Encounter: Payer: Self-pay | Admitting: Physical Therapy

## 2020-09-23 ENCOUNTER — Ambulatory Visit: Payer: Medicaid Other | Attending: Family Medicine | Admitting: Physical Therapy

## 2020-09-23 DIAGNOSIS — M545 Low back pain, unspecified: Secondary | ICD-10-CM | POA: Insufficient documentation

## 2020-09-23 DIAGNOSIS — M6281 Muscle weakness (generalized): Secondary | ICD-10-CM | POA: Diagnosis present

## 2020-09-23 DIAGNOSIS — G8929 Other chronic pain: Secondary | ICD-10-CM | POA: Diagnosis present

## 2020-09-23 NOTE — Therapy (Signed)
[place], [place], [address] Phone: [phone]   Fax:  [phone]  Physical Therapy Treatment  Patient Details  Name: Lindsay [name] MRN: [id number] Date of Birth: 05-Jul-2000 Referring Provider (PT): Eunice Blase, MD   Encounter Date: 09/23/2020   PT End of Session - 09/23/20 1345     Visit Number 9    Number of Visits 16    Date for PT Re-Evaluation 09/30/20    Authorization Type RadMD Approved 12 Pt visits from 08/10/2020-10/09/2020    Authorization - Visit Number 7    Authorization - Number of Visits 12    PT Start Time 6962    PT Stop Time 1426    PT Time Calculation (min) 41 min    Activity Tolerance Patient tolerated treatment well    Behavior During Therapy [facility] for tasks assessed/performed             Past Medical History:  Diagnosis Date   Abnormal liver enzymes    Heart murmur    PCOS (polycystic ovarian syndrome)     Past Surgical History:  Procedure Laterality Date   None      There were no vitals filed for this visit.   Subjective Assessment - 09/23/20 1346     Subjective Pt reports that her back is doing well.  She has been working on her planks.  Her pain reached 3/10 over the weekend.  Her pain is currently 0/10.  Aggs: sustained physical activity  Eases:  rest    Pertinent History History of 2 bulging disks 2019             Therapeutic Exercise, to improve core strength to reduce low back pain.  Pt cued for sequencing, pacing, and form throughout:  - Recumbent bike - 5' L5 - LE X-over - 2x10 ea - bird dog - 2x5 with 10'' hold with hand/knee tap - Side plank - 30'' x2 - plank from toes - 2 x 40''  - S/L bridge - 2x10 ea with 10# KB chest press - RDL to 8'' box 3x5 ea - 25# - with toe drag (not today) - dead lift - (raised bar in squat rack) 3x @ 75#, x2 @85 #, 2x 95#, 3x3 105#   NOT PERFORMED TODAY   - 1/2 kneeling chop - 10x ea - 10 lbs - dead bug with ball  - 2x14  - Side plank with clamshell - 2x10 ea - GTB (not today) - Hip abd machine @25  3x8 ea      PT Short Term Goals - 08/27/20 0837       PT SHORT TERM GOAL #1   Title [name] will be >75% HEP compliant within 3 weeks to improve carryover between sessions and facilitate independent management of condition.    Status Achieved    Target Date 08/03/20               PT Long Term Goals - 09/23/20 1404       PT LONG TERM GOAL #1   Title Vira Chaplin will be able to maintain plank for 25'' (norm for healthy adult is 70'')  EVAL: 20'' (from knees)    Baseline 8/19: 28'' from toes 9/9: 40''    Status Achieved      PT LONG TERM GOAL #2   Title Jozlynn Plaia will report confidence in self management of condition at time of discharge    Baseline 9/9: ongoing    Status  On-going      PT LONG TERM GOAL #3   Title Khyra Viscuso will be able to lift 45 lbs from the floor and place on a 3 foot counter, not limited by pain  EVAL: unable    Baseline 8/19: MET    Status Achieved                   Plan - 09/23/20 1357     Clinical Impression Statement Pt reports no increase in baseline pain following therapy  HEP was reviewed, but left unchanged    Overall, Jamaria Rennaker is progressing well with therapy.  Today we concentrated on core strengthening and lower extremity strengthening.  Pt continues to progress core, LE, and hip strength.  She will be ready for D/C within next 2 visits as her pain is only aggd with prolonged activity at this point.  D/L form looks good today with minimal cuing.  Pt will continue to benefit from skilled physical therapy to address remaining deficits and achieve listed goals.  Continue per POC.    PT Treatment/Interventions ADLs/Self Care Home Management;Aquatic Therapy;Iontophoresis 28m/ml Dexamethasone;Gait training;Therapeutic activities;Therapeutic exercise;Neuromuscular re-education;Manual techniques;Dry needling;Joint Manipulations;Spinal  Manipulations    PT Next Visit Plan progressive core strengthening and D/L progression    PT Home Exercise Plan [id number]    Consulted and Agree with Plan of Care Patient             Patient will benefit from skilled therapeutic intervention in order to improve the following deficits and impairments:  Pain, Decreased strength  Visit Diagnosis: Chronic right-sided low back pain without sciatica  Muscle weakness     Problem List Patient Active Problem List   Diagnosis Date Noted   Adjustment disorder with mixed anxiety and depressed mood 07/04/2016   Vitamin D deficiency 10/26/2014   Hyperlipidemia 10/26/2014   Acne vulgaris 10/26/2014   PCOS (polycystic ovarian syndrome) 08/09/2014   Irregular menses 06/06/2014   BMI (body mass index), pediatric, > 99% for age 17/22/2016   Acanthosis nigricans 06/06/2014    KMathis Dad PT 09/23/2020, 2:26 PM  CE[facility]daleCC[facility] Hospital188 Glen[address] StreetGElwood N[place] [address]Phone: [phone]  Fax:  [phone] Name: KRobina HamorMRN: [id number] of Birth: [birth year]

## 2020-09-27 ENCOUNTER — Other Ambulatory Visit: Payer: Self-pay

## 2020-09-27 ENCOUNTER — Encounter: Payer: Self-pay | Admitting: Physical Therapy

## 2020-09-27 ENCOUNTER — Ambulatory Visit: Payer: Medicaid Other | Admitting: Physical Therapy

## 2020-09-27 DIAGNOSIS — M545 Low back pain, unspecified: Secondary | ICD-10-CM | POA: Diagnosis not present

## 2020-09-27 DIAGNOSIS — G8929 Other chronic pain: Secondary | ICD-10-CM

## 2020-09-27 DIAGNOSIS — M6281 Muscle weakness (generalized): Secondary | ICD-10-CM

## 2020-09-27 NOTE — Therapy (Signed)
[place], [place], [address] Phone: [phone]   Fax:  [phone]  Physical Therapy Treatment  Patient Details  Name: Lindsay [name] MRN: [id number] Date of Birth: 2000/11/11 Referring Provider (PT): Lindsay Blase, MD   Encounter Date: 09/27/2020   PT End of Session - 09/27/20 1532     Visit Number 10    Number of Visits 16    Date for PT Re-Evaluation 09/30/20    Authorization Type RadMD Approved 12 Pt visits from 08/10/2020-10/09/2020    Authorization - Visit Number 7    Authorization - Number of Visits 12    PT Start Time 2549    PT Stop Time 1612    PT Time Calculation (min) 41 min    Activity Tolerance Patient tolerated treatment well    Behavior During Therapy [facility] for tasks assessed/performed             Past Medical History:  Diagnosis Date   Abnormal liver enzymes    Heart murmur    PCOS (polycystic ovarian syndrome)     Past Surgical History:  Procedure Laterality Date   None      There were no vitals filed for this visit.   Subjective Assessment - 09/27/20 1536     Subjective Pt reports that she is feeling good ovaerall.  She had some DOMS after last visit, but this has now dissipated.  Her pain reached 2/10 over the weekend.  Her pain is currently 0/10.  Aggs: sustained physical activity  Eases:  rest    Pertinent History History of 2 bulging disks 2019                [facility] PT Assessment - 09/27/20 0001       Other:   Other/ Comments Plank: 38''             Therapeutic Exercise, to improve core strength to reduce low back pain.  Pt cued for sequencing, pacing, and form throughout:   - Recumbent bike - 5' L5 - LE X-over - 2x10 ea - bird dog - 2x5 with 10'' hold with hand/knee tap - Side plank - 40'' x2 - plank from toes - 2 x 40''  - S/L bridge - 2x10 ea with 15# KB chest press - RDL to 8'' box 3x5 ea - 25# - with toe drag (not today) - dead lift -  (raised bar in squat rack) 3x @ 75#, 2x3 95#, 2x3 105#, 2x 115#   NOT PERFORMED TODAY   - 1/2 kneeling chop - 10x ea - 10 lbs - dead bug with ball - 2x14  - Side plank with clamshell - 2x10 ea - GTB (not today) - Hip abd machine _0  3x8 ea    PT Short Term Goals - 08/27/20 0837       PT SHORT TERM GOAL #1   Title Lindsay [name] will be >75% HEP compliant within 3 weeks to improve carryover between sessions and facilitate independent management of condition.    Status Achieved    Target Date 08/03/20               PT Long Term Goals - 09/23/20 1404       PT LONG TERM GOAL #1   Title Lindsay [name] will be able to maintain plank for 62'' (norm for healthy adult is 70'')  EVAL: 20'' (from knees)    Baseline 8/19: 28'' from toes 9/9: 40''    Status  Achieved      PT LONG TERM GOAL #2   Title Lindsay [name] will report confidence in self management of condition at time of discharge    Baseline 9/9: ongoing    Status On-going      PT LONG TERM GOAL #3   Title Lindsay [name] will be able to lift 45 lbs from the floor and place on a 3 foot counter, not limited by pain  EVAL: unable    Baseline 8/19: MET    Status Achieved                   Plan - 09/27/20 1544     Clinical Impression Statement Pt reports no increase in baseline pain following therapy  HEP was reviewed, but left unchanged    Overall, Lindsay [name] is progressing well with therapy.  Today we concentrated on core strengthening and hip strengthening.  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  Pt will continue to benefit from skilled physical therapy to address remaining deficits and achieve listed goals.  Continue per POC.    PT Treatment/Interventions ADLs/Self Care Home Management;Aquatic Therapy;Iontophoresis 74m/ml Dexamethasone;Gait training;Therapeutic activities;Therapeutic  exercise;Neuromuscular re-education;Manual techniques;Dry needling;Joint Manipulations;Spinal Manipulations    PT Next Visit Plan progressive core strengthening and D/L progression    PT Home Exercise Plan [id number]    Consulted and Agree with Plan of Care Patient             Patient will benefit from skilled therapeutic intervention in order to improve the following deficits and impairments:  Pain, Decreased strength  Visit Diagnosis: Chronic right-sided low back pain without sciatica  Muscle weakness     Problem List Patient Active Problem List   Diagnosis Date Noted   Adjustment disorder with mixed anxiety and depressed mood 07/04/2016   Vitamin D deficiency 10/26/2014   Hyperlipidemia 10/26/2014   Acne vulgaris 10/26/2014   PCOS (polycystic ovarian syndrome) 08/09/2014   Irregular menses 06/06/2014   BMI (body mass index), pediatric, > 99% for age 56/22/2016   Acanthosis nigricans 06/06/2014    Lindsay [name] PT 09/27/2020, 4ChildressCRex Surgery Center [facility] LLC[address] [address]Phone: [phone]  Fax:  [phone] Name: Lindsay VialpandoMRN: [id number] of Birth: 7February 01, 2002

## 2020-09-30 ENCOUNTER — Encounter: Payer: Self-pay | Admitting: Physical Therapy

## 2020-09-30 ENCOUNTER — Ambulatory Visit: Payer: Medicaid Other | Admitting: Physical Therapy

## 2020-09-30 ENCOUNTER — Other Ambulatory Visit: Payer: Self-pay

## 2020-09-30 DIAGNOSIS — M545 Low back pain, unspecified: Secondary | ICD-10-CM | POA: Diagnosis not present

## 2020-09-30 DIAGNOSIS — M6281 Muscle weakness (generalized): Secondary | ICD-10-CM

## 2020-09-30 DIAGNOSIS — G8929 Other chronic pain: Secondary | ICD-10-CM

## 2020-09-30 NOTE — Therapy (Signed)
[place], [place], [address] Phone: [phone]   Fax:  [phone]  PHYSICAL THERAPY DISCHARGE SUMMARY  Visits from Start of Care: 11  Current functional level related to goals / functional outcomes: See assessment/goals   Remaining deficits: See assessment/goals   Education / Equipment: HEP and D/C plans  Patient agrees to discharge. Patient goals were met. Patient is being discharged due to meeting the stated rehab goals.   Physical Therapy Treatment  Patient Details  Name: Lindsay [name] MRN: [id number] Date of Birth: [birth year] Referring Provider (PT): Eunice Blase, MD   Encounter Date: 09/30/2020   PT End of Session - 09/30/20 1337     Visit Number 11    Number of Visits 16    Date for PT Re-Evaluation 09/30/20    Authorization Type [employer] Approved 12 Pt visits from 08/10/2020-10/09/2020    Authorization - Visit Number 7    Authorization - Number of Visits 12    PT Start Time 1340    PT Stop Time 1410    PT Time Calculation (min) 30 min    Activity Tolerance Patient tolerated treatment well    Behavior During Therapy WFL for tasks assessed/performed             Past Medical History:  Diagnosis Date   Abnormal liver enzymes    Heart murmur    PCOS (polycystic ovarian syndrome)     Past Surgical History:  Procedure Laterality Date   None      There were no vitals filed for this visit.   Subjective Assessment - 09/30/20 1341     Subjective Pt reports signifcant improvement overall.  She feels confident in self management at home.  Her pain is currently 0/10.  Aggs: sustained physical activity  Eases:  rest    Pertinent History History of 2 bulging disks 2019             Plank: 59''  Sustained S/L plank from feet: L 50'' R 51'' (norm >30'')    Therapeutic Exercise, to improve core strength to reduce low back pain.  Pt cued for sequencing, pacing, and form  throughout:   - Recumbent bike - 5' L5 - Side plank - plank from toes  - dead lift - (raised bar in squat rack) 3x @ 75#, 2x 105#, 2x 125#  Barron/HM - educating patient on machines she might find at the gym and how to progress strength exercises for bench press, leg press, lat pull down, row, knee ext, HS curl     PT Short Term Goals - 08/27/20 0837       PT SHORT TERM GOAL #1   Title Lindsay [name] will be >75% HEP compliant within 3 weeks to improve carryover between sessions and facilitate independent management of condition.    Status Achieved    Target Date 08/03/20               PT Long Term Goals - 09/30/20 1348       PT LONG TERM GOAL #1   Title Lindsay [name] will be able to maintain plank for 40'' (norm for healthy adult is 70'')  EVAL: 20'' (from knees)    Baseline 8/19: 28'' from toes 9/9: 40'' 9/16: 5''    Status Achieved      PT LONG TERM GOAL #2   Title Lindsay [name] will report confidence in self management of condition at time of discharge    Baseline 9/9:  ongoing 9/16: confident    Status Achieved      PT LONG TERM GOAL #3   Title Lindsay [name] will be able to lift 45 lbs from the floor and place on a 3 foot counter, not limited by pain  EVAL: unable    Baseline 8/19: MET    Status Achieved                   Plan - 09/30/20 1411     Clinical Impression Statement Lindsay [name] has progressed very well with therapy.  Improved impairments include: core strength, functional strength, core endurance, pain.  Functional improvements include: lifting objects from floor, reduced pain with ADLs, able to pick up her nephew.  Progressions needed include: continued work at home with HEP; possibly joining a gym.  Barriers to progress include: none.  Please see baseline and/or status section in "Goals" for specific progress on short term and long term goals established at evaluation.  I recommend D/C home with HEP; pt agrees with plan.    PT  Treatment/Interventions ADLs/Self Care Home Management;Aquatic Therapy;Iontophoresis 39m/ml Dexamethasone;Gait training;Therapeutic activities;Therapeutic exercise;Neuromuscular re-education;Manual techniques;Dry needling;Joint Manipulations;Spinal Manipulations    PT Next Visit Plan progressive core strengthening and D/L progression    PT Home Exercise Plan [id number]    Consulted and Agree with Plan of Care Patient             Patient will benefit from skilled therapeutic intervention in order to improve the following deficits and impairments:  Pain, Decreased strength  Visit Diagnosis: Chronic right-sided low back pain without sciatica  Muscle weakness     Problem List Patient Active Problem List   Diagnosis Date Noted   Adjustment disorder with mixed anxiety and depressed mood 07/04/2016   Vitamin D deficiency 10/26/2014   Hyperlipidemia 10/26/2014   Acne vulgaris 10/26/2014   PCOS (polycystic ovarian syndrome) 08/09/2014   Irregular menses 06/06/2014   BMI (body mass index), pediatric, > 99% for age 16/22/2016   Acanthosis nigricans 06/06/2014    KMathis Dad PT 09/30/2020, 2:13 PM  [facility] [address]Phone: [phone]  Fax:  [phone] Name: Lindsay CrowlMRN: [id number] of Birth: [birth year]

## 2021-07-12 ENCOUNTER — Emergency Department (HOSPITAL_BASED_OUTPATIENT_CLINIC_OR_DEPARTMENT_OTHER)
Admission: EM | Admit: 2021-07-12 | Discharge: 2021-07-13 | Disposition: A | Discharge status: Home / Self Care | Payer: Medicaid Other | Attending: Emergency Medicine | Admitting: Emergency Medicine

## 2021-07-12 ENCOUNTER — Other Ambulatory Visit: Payer: Self-pay

## 2021-07-12 ENCOUNTER — Encounter (HOSPITAL_BASED_OUTPATIENT_CLINIC_OR_DEPARTMENT_OTHER): Payer: Self-pay

## 2021-07-12 DIAGNOSIS — D72829 Elevated white blood cell count, unspecified: Secondary | ICD-10-CM | POA: Diagnosis not present

## 2021-07-12 DIAGNOSIS — F17290 Nicotine dependence, other tobacco product, uncomplicated: Secondary | ICD-10-CM | POA: Insufficient documentation

## 2021-07-12 DIAGNOSIS — N12 Tubulo-interstitial nephritis, not specified as acute or chronic: Secondary | ICD-10-CM | POA: Diagnosis not present

## 2021-07-12 DIAGNOSIS — R1030 Lower abdominal pain, unspecified: Secondary | ICD-10-CM | POA: Diagnosis present

## 2021-07-12 LAB — BASIC METABOLIC PANEL
Anion gap: 11 (ref 5–15)
BUN: 15 mg/dL (ref 6–20)
CO2: 21 mmol/L — ABNORMAL LOW (ref 22–32)
Calcium: 10.2 mg/dL (ref 8.9–10.3)
Chloride: 102 mmol/L (ref 98–111)
Creatinine, Ser: 0.78 mg/dL (ref 0.44–1.00)
GFR, Estimated: >60 mL/min (ref >60)
Glucose, Bld: 107 mg/dL — ABNORMAL HIGH (ref 70–99)
Potassium: 4.2 mmol/L (ref 3.5–5.1)
Sodium: 134 mmol/L — ABNORMAL LOW (ref 135–145)

## 2021-07-12 LAB — CBC
HCT: 41.7 % (ref 36.0–46.0)
Hemoglobin: 13.5 g/dL (ref 12.0–15.0)
MCH: 28.1 pg (ref 26.0–34.0)
MCHC: 32.4 g/dL (ref 30.0–36.0)
MCV: 86.9 fL (ref 80.0–100.0)
Platelets: 391 10*3/uL (ref 150–400)
RBC: 4.8 MIL/uL (ref 3.87–5.11)
RDW: 13.1 % (ref 11.5–15.5)
WBC: 18.8 10*3/uL — ABNORMAL HIGH (ref 4.0–10.5)
nRBC: 0 % (ref 0.0–0.2)

## 2021-07-12 LAB — URINALYSIS, ROUTINE W REFLEX MICROSCOPIC
Bilirubin Urine: NEGATIVE
Glucose, UA: NEGATIVE mg/dL
Ketones, ur: 15 mg/dL — AB
Nitrite: NEGATIVE
Specific Gravity, Urine: 1.03 (ref 1.005–1.030)
pH: 5.5 (ref 5.0–8.0)

## 2021-07-12 LAB — PREGNANCY, URINE: Preg Test, Ur: NEGATIVE

## 2021-07-12 MED ORDER — KETOROLAC TROMETHAMINE 15 MG/ML IJ SOLN
15.0000 mg | Freq: Once | INTRAMUSCULAR | Status: AC
  Administered 2021-07-12: 15 mg via INTRAVENOUS
  Filled 2021-07-12: qty 1

## 2021-07-12 MED ORDER — SODIUM CHLORIDE 0.9 % IV BOLUS
1000.0000 mL | Freq: Once | INTRAVENOUS | Status: AC
  Administered 2021-07-12: 1000 mL via INTRAVENOUS

## 2021-07-12 MED ORDER — SODIUM CHLORIDE 0.9 % IV SOLN
2.0000 g | Freq: Once | INTRAVENOUS | Status: AC
  Administered 2021-07-13: 2 g via INTRAVENOUS
  Filled 2021-07-12: qty 20

## 2021-07-12 MED ORDER — ONDANSETRON HCL 4 MG/2ML IJ SOLN
4.0000 mg | Freq: Once | INTRAMUSCULAR | Status: AC
  Administered 2021-07-12: 4 mg via INTRAVENOUS
  Filled 2021-07-12: qty 2

## 2021-07-12 NOTE — ED Triage Notes (Signed)
Patient here POV from Home.  Endorses having UTI Symptoms such as ABD Cramping, Dysuria, and Flank Pain. Seen at [facility] and diagnosed with UTI related to E. Coli Infection and given Antibiotics.   Symptoms improved somewhat but last PM Symptoms including N/V/D, Pelvic and Flank Pain/Cramping.  100 Temperature at Home today.   NAD Noted during Triage. A&Ox4. Gcs 15. Ambulatory.

## 2021-07-13 MED ORDER — CEPHALEXIN 500 MG PO CAPS: 500.0000 mg | ORAL_CAPSULE | Freq: Four times a day (QID) | ORAL | 0 refills | Status: AC

## 2021-07-13 MED ORDER — ONDANSETRON 4 MG PO TBDP: 4.0000 mg | ORAL_TABLET | Freq: Three times a day (TID) | ORAL | 0 refills | Status: AC | PRN

## 2021-07-13 MED ORDER — NAPROXEN 500 MG PO TABS: 500.0000 mg | ORAL_TABLET | Freq: Two times a day (BID) | ORAL | 0 refills | Status: AC

## 2021-07-13 NOTE — Discharge Instructions (Signed)
You were evaluated in the Emergency Department and after careful evaluation, we did not find any emergent condition requiring admission or further testing in the hospital.  Your exam/testing today is overall reassuring.  Symptoms seem to be due to pyelonephritis, or kidney infection.  Take the Keflex antibiotic as directed to clear the infection.  Use the Zofran as needed for nausea, use the Naprosyn twice daily as needed for pain.  Please return to the Emergency Department if you experience any worsening of your condition.   Thank you for allowing [place] to be a part of your care.

## 2021-07-13 NOTE — ED Provider Notes (Signed)
[facility] Emergency Department Provider Note MRN:  [id number]  Arrival date & time: 07/13/21     Chief Complaint   Abdominal pain History of Present Illness   Lindsay [name] is a 21 y.o. year-old female with no pertinent past medical history presenting to the ED with chief complaint of abdominal pain.  Suprapubic abdominal pain for the past several days, diagnosed with a UTI and started on Macrobid.  Brief improvement followed by worsening of symptoms this evening, lower abdominal pain, left flank pain, temperature 100 at home.  Nausea.  Review of Systems  A thorough review of systems was obtained and all systems are negative except as noted in the HPI and PMH.   Patient's Health History    Past Medical History:  Diagnosis Date   Abnormal liver enzymes    Heart murmur    PCOS (polycystic ovarian syndrome)     Past Surgical History:  Procedure Laterality Date   None      Family History  Problem Relation Age of Onset   Diabetes Other    Hypertension Other    Hyperlipidemia Other    Heart disease Other     Social History   Socioeconomic History   Marital status: Single    Spouse name: Not on file   Number of children: 0   Years of education: Not on file   Highest education level: Not on file  Occupational History   Not on file  Tobacco Use   Smoking status: Every Day    Types: E-cigarettes    Passive exposure: Yes   Smokeless tobacco: Never   Tobacco comments:    dads smokes inside mom smokes outside   Substance and Sexual Activity   Alcohol use: Not Currently   Drug use: Yes    Frequency: 7.0 times per week   Sexual activity: Not on file  Other Topics Concern   Not on file  Social History Narrative   Not on file   Social Determinants of Health   Financial Resource Strain: Not on file  Food Insecurity: Not on file  Transportation Needs: Not on file  Physical Activity: Not on file  Stress: Not on file  Social Connections: Not  on file  Intimate Partner Violence: Not on file     Physical Exam   Vitals:   07/13/21 0030 07/13/21 0045  BP: (!) 112/94 (!) 109/97  Pulse: 74 82  Resp:  16  Temp:    SpO2: 99% 99%    CONSTITUTIONAL: Well-appearing, NAD NEURO/PSYCH:  Alert and oriented x 3, no focal deficits EYES:  eyes equal and reactive ENT/NECK:  no LAD, no JVD CARDIO: Regular rate, well-perfused, normal S1 and S2 PULM:  CTAB no wheezing or rhonchi GI/GU:  non-distended, non-tender MSK/SPINE:  No gross deformities, no edema SKIN:  no rash, atraumatic   *Additional and/or pertinent findings included in MDM below  Diagnostic and Interventional Summary    EKG Interpretation  Date/Time:    Ventricular Rate:    PR Interval:    QRS Duration:   QT Interval:    QTC Calculation:   R Axis:     Text Interpretation:         Labs Reviewed  URINALYSIS, ROUTINE W REFLEX MICROSCOPIC - Abnormal; Notable for the following components:      Result Value   Hgb urine dipstick LARGE (*)    Ketones, ur 15 (*)    Protein, ur TRACE (*)    Leukocytes,Ua TRACE (*)  All other components within normal limits  BASIC METABOLIC PANEL - Abnormal; Notable for the following components:   Sodium 134 (*)    CO2 21 (*)    Glucose, Bld 107 (*)    All other components within normal limits  CBC - Abnormal; Notable for the following components:   WBC 18.8 (*)    All other components within normal limits  PREGNANCY, URINE    No orders to display    Medications  sodium chloride 0.9 % bolus 1,000 mL (1,000 mLs Intravenous New Bag/Given 07/12/21 2359)  ketorolac (TORADOL) 15 MG/ML injection 15 mg (15 mg Intravenous Given 07/12/21 2357)  ondansetron (ZOFRAN) injection 4 mg (4 mg Intravenous Given 07/12/21 2357)  cefTRIAXone (ROCEPHIN) 2 g in sodium chloride 0.9 % 100 mL IVPB (0 g Intravenous Stopped 07/13/21 0031)     Procedures  /  Critical Care Procedures  ED Course and Medical Decision Making  Initial Impression and  Ddx Clinically suspicious for pyelonephritis given the known UTI.  Mom at bedside explains that they were called with culture results, E. coli UTI.  Macrobid notorious for not being able to clear pyelonephritis.  Concomitant kidney stone considered but felt to be less likely given the clinical picture of gradual pain, sitting comfortably at this time.  No history of kidney stones.  Labs reveal prominent leukocytosis at 18, otherwise reassuring without significant blood count or electrolyte disturbance.  Providing ceftriaxone, symptom control and will reassess.  Past medical/surgical history that increases complexity of ED encounter: None  Interpretation of Diagnostics I personally reviewed the laboratory evaluation and my interpretation is as follows: See above    Patient Reassessment and Ultimate Disposition/Management     On reassessment patient is feeling better and is ready for discharge home.  Patient management required discussion with the following services or consulting groups:  None  Complexity of Problems Addressed Acute illness or injury that poses threat of life of bodily function  Additional Data Reviewed and Analyzed Further history obtained from: Further history from spouse/family member  Additional Factors Impacting ED Encounter Risk Prescriptions  Elmer Sow. Pilar Plate, MD [facility] Emergency Medicine [facility] [email or web address]  Final Clinical Impressions(s) / ED Diagnoses     ICD-10-CM   1. Pyelonephritis  N12       ED Discharge Orders          Ordered    cephALEXin (KEFLEX) 500 MG capsule  4 times daily        07/13/21 0056    naproxen (NAPROSYN) 500 MG tablet  2 times daily        07/13/21 0056    ondansetron (ZOFRAN-ODT) 4 MG disintegrating tablet  Every 8 hours PRN        07/13/21 0056             Discharge Instructions Discussed with and Provided to Patient:     Discharge Instructions      You were evaluated in the  Emergency Department and after careful evaluation, we did not find any emergent condition requiring admission or further testing in the hospital.  Your exam/testing today is overall reassuring.  Symptoms seem to be due to pyelonephritis, or kidney infection.  Take the Keflex antibiotic as directed to clear the infection.  Use the Zofran as needed for nausea, use the Naprosyn twice daily as needed for pain.  Please return to the Emergency Department if you experience any worsening of your condition.   Thank you for allowing [place]  to be a part of your care.       Sabas Sous, MD 07/13/21 [id number]

## 2022-07-20 ENCOUNTER — Emergency Department
Admission: EM | Admit: 2022-07-20 | Discharge: 2022-07-20 | Disposition: A | Discharge status: Home / Self Care | Payer: Medicaid Other | Attending: Emergency Medicine | Admitting: Emergency Medicine

## 2022-07-20 ENCOUNTER — Other Ambulatory Visit: Payer: Self-pay

## 2022-07-20 DIAGNOSIS — G43009 Migraine without aura, not intractable, without status migrainosus: Secondary | ICD-10-CM

## 2022-07-20 DIAGNOSIS — R519 Headache, unspecified: Secondary | ICD-10-CM | POA: Diagnosis present

## 2022-07-20 DIAGNOSIS — G43909 Migraine, unspecified, not intractable, without status migrainosus: Secondary | ICD-10-CM | POA: Diagnosis not present

## 2022-07-20 LAB — URINALYSIS, ROUTINE W REFLEX MICROSCOPIC
Bacteria, UA: NONE SEEN
Bilirubin Urine: NEGATIVE
Glucose, UA: NEGATIVE mg/dL
Ketones, ur: 20 mg/dL — AB
Leukocytes,Ua: NEGATIVE
Nitrite: NEGATIVE
Protein, ur: NEGATIVE mg/dL
Specific Gravity, Urine: 1.024 (ref 1.005–1.030)
pH: 5 (ref 5.0–8.0)

## 2022-07-20 MED ORDER — DIPHENHYDRAMINE HCL 50 MG/ML IJ SOLN
25.0000 mg | Freq: Once | INTRAMUSCULAR | Status: AC
  Administered 2022-07-20: 25 mg via INTRAVENOUS
  Filled 2022-07-20: qty 1

## 2022-07-20 MED ORDER — KETOROLAC TROMETHAMINE 15 MG/ML IJ SOLN
15.0000 mg | Freq: Once | INTRAMUSCULAR | Status: AC
Start: 2022-07-20 — End: 2022-07-20
  Administered 2022-07-20: 15 mg via INTRAVENOUS
  Filled 2022-07-20: qty 1

## 2022-07-20 MED ORDER — METOCLOPRAMIDE HCL 5 MG/ML IJ SOLN
10.0000 mg | Freq: Once | INTRAMUSCULAR | Status: AC
  Administered 2022-07-20: 10 mg via INTRAVENOUS
  Filled 2022-07-20: qty 2

## 2022-07-20 MED ORDER — SODIUM CHLORIDE 0.9 % IV BOLUS
500.0000 mL | Freq: Once | INTRAVENOUS | Status: AC
  Administered 2022-07-20: 500 mL via INTRAVENOUS

## 2022-07-20 NOTE — ED Notes (Signed)
Pt in NAD. Visitor remains at bedside.  

## 2022-07-20 NOTE — ED Provider Notes (Signed)
[facility] Emergency Department Provider Note     Event Date/Time   First MD Initiated Contact with Patient 07/20/22 1646     (approximate)   History   No chief complaint on file.   HPI  Lindsay [name] is a 22 y.o. female with no significant past medical history presents to the ED for evaluation of a headache that started this morning.  Patient describes headache as throbbing, pulsating over her forehead and posterior scalp.  Associated symptoms include light sensitivity and nausea.  No history of migraines.  Denies fever and chills.  Patient also has complaint of burning with urination for a couple of days.  She is requesting for urinalysis.  She denies vaginal itching, vaginal bleeding and abdominal cramping.  Physical Exam   Triage Vital Signs: ED Triage Vitals [07/20/22 1551]  Enc Vitals Group     BP (!) 135/99     Pulse Rate 78     Resp (!) 100     Temp 97.8 F (36.6 C)     Temp Source Oral     SpO2 100 %     Weight 200 lb (90.7 kg)     Height 5\' 5"  (1.651 m)     Head Circumference      Peak Flow      Pain Score 7     Pain Loc      Pain Edu?      Excl. in GC?     Most recent vital signs: Vitals:   07/20/22 1815 07/20/22 1857  BP:    Pulse: 68   Resp:  17  Temp:    SpO2: 100%    General: Alert and oriented. INAD.  Lying in dark room. Skin:  Warm, dry and intact. No rashes or lesions noted.     Head:  NCAT.  Eyes:  PERRLA. EOMI. Conjunctivae clear. CV:  Good peripheral perfusion. RRR.  RESP:  Normal effort. LCTAB.  BACK:  No CVA tenderness. NEURO: Cranial nerves II-XII intact. No focal deficits. Sensation and motor function intact.    ED Results / Procedures / Treatments   Labs (all labs ordered are listed, but only abnormal results are displayed) Labs Reviewed  URINALYSIS, ROUTINE W REFLEX MICROSCOPIC - Abnormal; Notable for the following components:      Result Value   Color, Urine YELLOW (*)    APPearance CLEAR (*)     Hgb urine dipstick SMALL (*)    Ketones, ur 20 (*)    All other components within normal limits   History and physical examination do not warrant a lab work up or imaging at this time.  No results found.  PROCEDURES:  Critical Care performed: No  Procedures   MEDICATIONS ORDERED IN ED: Medications  metoCLOPramide (REGLAN) injection 10 mg (10 mg Intravenous Given 07/20/22 1721)  diphenhydrAMINE (BENADRYL) injection 25 mg (25 mg Intravenous Given 07/20/22 1720)  ketorolac (TORADOL) 15 MG/ML injection 15 mg (15 mg Intravenous Given 07/20/22 1719)  sodium chloride 0.9 % bolus 500 mL (0 mLs Intravenous Stopped 07/20/22 1800)   IMPRESSION / MDM / ASSESSMENT AND PLAN / ED COURSE  I reviewed the triage vital signs and the nursing notes.                               22 y.o. female presents to the emergency department for evaluation and treatment of acute headache. See HPI for further  details.   Differential diagnosis includes, but is not limited to migraine, tension headache, UTI, pyelonephritis.  Physical exam findings are overall benign.  Migraine cocktail administered with complete resolution of symptoms.  Urinalysis reassuring.  There is a small amount of Hgb noted.  Patient informed.  Patient is in satisfactory and stable condition for discharge and outpatient follow up.  Patient is to follow up with her primary care in 1 week. Patient is given ED precautions to return to the ED for any worsening or new symptoms. Patient verbalizes understanding. All questions and concerns were addressed during ED visit.    Patient's presentation is most consistent with acute illness / injury with system symptoms.  FINAL CLINICAL IMPRESSION(S) / ED DIAGNOSES   Final diagnoses:  Migraine without aura and without status migrainosus, not intractable    Rx / DC Orders   ED Discharge Orders     None        Note:  This document was prepared using Dragon voice recognition software and may include  unintentional dictation errors.    Romeo Apple, Volanda Mangine A, PA-C 07/20/22 1943    Dionne Bucy, MD 07/21/22 0020

## 2022-07-20 NOTE — ED Triage Notes (Signed)
Pt sts that she has been having a migraine all day. Pt sts that she is light sensitive when outside however she is not nauseated.

## 2022-07-20 NOTE — ED Notes (Signed)
Pt reports felt like head "popped" while sitting down using the restroom this morning; states since this pt has had migraine symptoms; states nausea when lights are turned on. Pt in NAD. Visitor at bedside.

## 2022-07-20 NOTE — ED Notes (Signed)
Pt given warm blanket and lights dimmed.
